# Patient Record
Sex: Male | Born: 1970 | Race: White | Hispanic: No | Marital: Married | Smoking: Never smoker
Health system: Southern US, Community
[De-identification: ages and names within clinical notes are randomized; demographics above are authoritative.]

---

## 2011-04-01 DIAGNOSIS — N400 Enlarged prostate without lower urinary tract symptoms: Secondary | ICD-10-CM | POA: Insufficient documentation

## 2013-01-19 DIAGNOSIS — F329 Major depressive disorder, single episode, unspecified: Secondary | ICD-10-CM | POA: Insufficient documentation

## 2013-01-19 DIAGNOSIS — F419 Anxiety disorder, unspecified: Secondary | ICD-10-CM

## 2013-01-19 DIAGNOSIS — F410 Panic disorder [episodic paroxysmal anxiety] without agoraphobia: Secondary | ICD-10-CM | POA: Insufficient documentation

## 2013-06-29 DIAGNOSIS — M25569 Pain in unspecified knee: Secondary | ICD-10-CM | POA: Insufficient documentation

## 2013-06-29 DIAGNOSIS — M79643 Pain in unspecified hand: Secondary | ICD-10-CM | POA: Insufficient documentation

## 2013-06-29 DIAGNOSIS — M064 Inflammatory polyarthropathy: Secondary | ICD-10-CM | POA: Insufficient documentation

## 2013-07-14 DIAGNOSIS — E785 Hyperlipidemia, unspecified: Secondary | ICD-10-CM | POA: Insufficient documentation

## 2013-08-09 DIAGNOSIS — M1990 Unspecified osteoarthritis, unspecified site: Secondary | ICD-10-CM | POA: Insufficient documentation

## 2014-01-07 DIAGNOSIS — G43109 Migraine with aura, not intractable, without status migrainosus: Secondary | ICD-10-CM | POA: Insufficient documentation

## 2015-07-23 ENCOUNTER — Other Ambulatory Visit: Payer: Self-pay | Admitting: Neurosurgery

## 2015-07-23 DIAGNOSIS — M5412 Radiculopathy, cervical region: Secondary | ICD-10-CM

## 2015-07-31 ENCOUNTER — Ambulatory Visit
Admission: RE | Admit: 2015-07-31 | Discharge: 2015-07-31 | Disposition: A | Discharge status: Home / Self Care | Payer: BC Managed Care – PPO | Source: Ambulatory Visit | Attending: Neurosurgery | Admitting: Neurosurgery

## 2015-07-31 ENCOUNTER — Other Ambulatory Visit: Payer: Self-pay

## 2015-07-31 DIAGNOSIS — M5412 Radiculopathy, cervical region: Secondary | ICD-10-CM

## 2015-07-31 DIAGNOSIS — E669 Obesity, unspecified: Secondary | ICD-10-CM | POA: Insufficient documentation

## 2015-07-31 DIAGNOSIS — E66811 Obesity, class 1: Secondary | ICD-10-CM | POA: Insufficient documentation

## 2015-07-31 DIAGNOSIS — Z91030 Bee allergy status: Secondary | ICD-10-CM | POA: Insufficient documentation

## 2015-07-31 MED ORDER — IOPAMIDOL (ISOVUE-M 300) INJECTION 61%
10.0000 mL | Freq: Once | INTRAMUSCULAR | Status: AC | PRN
  Administered 2015-07-31: 10 mL via INTRATHECAL

## 2015-07-31 MED ORDER — DIAZEPAM 5 MG PO TABS
10.0000 mg | ORAL_TABLET | Freq: Once | ORAL | Status: AC
  Administered 2015-07-31: 10 mg via ORAL

## 2015-07-31 NOTE — Progress Notes (Signed)
Pt states he has been off Cymbalta since Saturday.

## 2015-07-31 NOTE — Discharge Instructions (Signed)
Myelogram Discharge Instructions  1. Go home and rest quietly for the next 24 hours.  It is important to lie flat for the next 24 hours.  Get up only to go to the restroom.  You may lie in the bed or on a couch on your back, your stomach, your left side or your right side.  You may have one pillow under your head.  You may have pillows between your knees while you are on your side or under your knees while you are on your back.  2. DO NOT drive today.  Recline the seat as far back as it will go, while still wearing your seat belt, on the way home.  3. You may get up to go to the bathroom as needed.  You may sit up for 10 minutes to eat.  You may resume your normal diet and medications unless otherwise indicated.  Drink lots of extra fluids today and tomorrow.  4. The incidence of headache, nausea, or vomiting is about 5% (one in 20 patients).  If you develop a headache, lie flat and drink plenty of fluids until the headache goes away.  Caffeinated beverages may be helpful.  If you develop severe nausea and vomiting or a headache that does not go away with flat bed rest, call [phone].  5. You may resume normal activities after your 24 hours of bed rest is over; however, do not exert yourself strongly or do any heavy lifting tomorrow. If when you get up you have a headache when standing, go back to bed and force fluids for another 24 hours.  6. Call your physician for a follow-up appointment.  The results of your myelogram will be sent directly to your physician by the following day.  7. If you have any questions or if complications develop after you arrive home, please call [phone].  Discharge instructions have been explained to the patient.  The patient, or the person responsible for the patient, fully understands these instructions.       May resume Lexapro on Aug 01, 2015, after 11:00 am.

## 2017-08-16 IMAGING — CR DG MYELOGRAPHY LUMBAR INJ CERVICAL
14 series · 14 of 14 positions shown · non-contrast
Comparison: none

CLINICAL DATA: Neck pain.  LEFT arm pain.
TECHNIQUE: Contiguous axial images were obtained through the Cervical spine
after the intrathecal infusion of infusion. Coronal and sagittal
reconstructions were obtained of the axial image sets.

[w cervical spine lat]
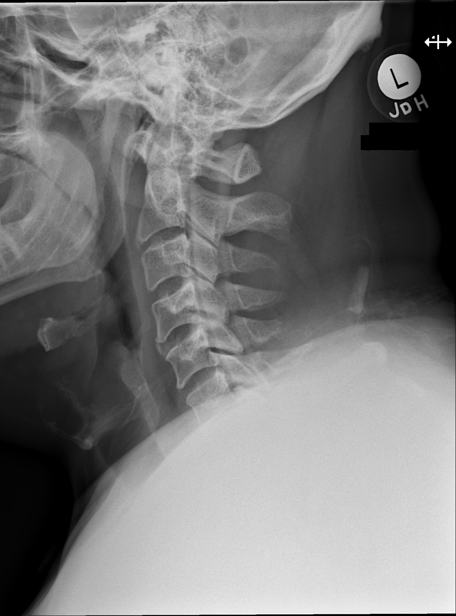

[vasc adipose (1 of 11)]
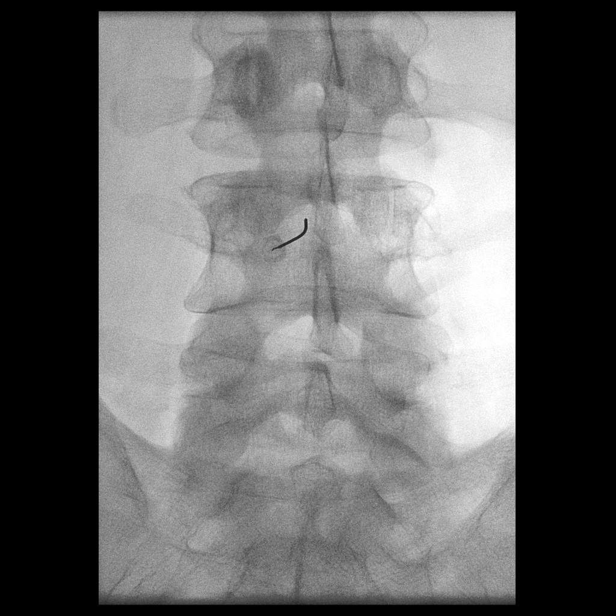

[w cervical spine flexion]
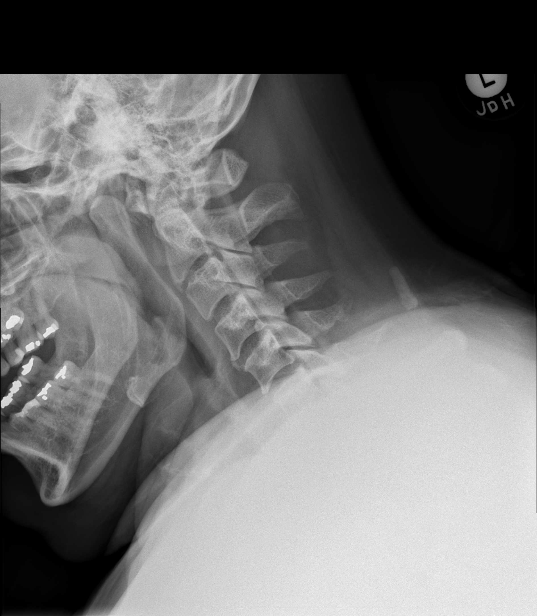

[vasc adipose (2 of 11)]
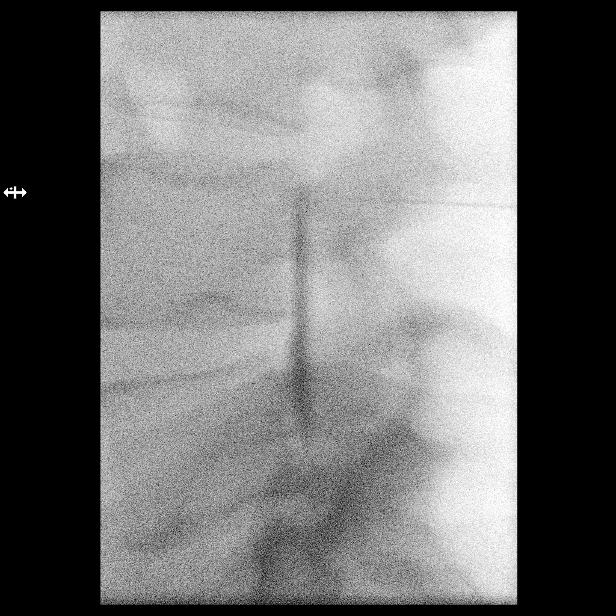

[w cervical spine extension]
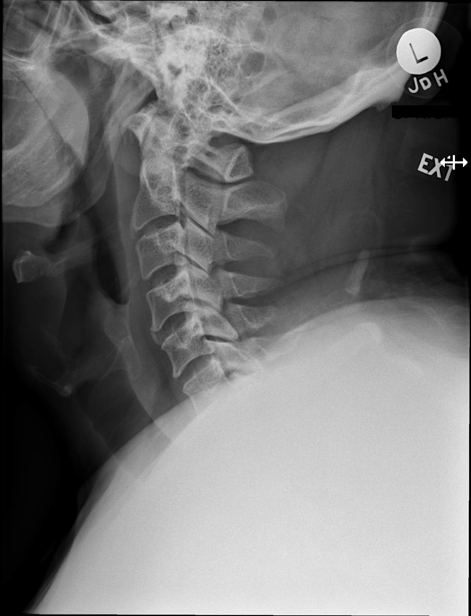

[vasc adipose (3 of 11)]
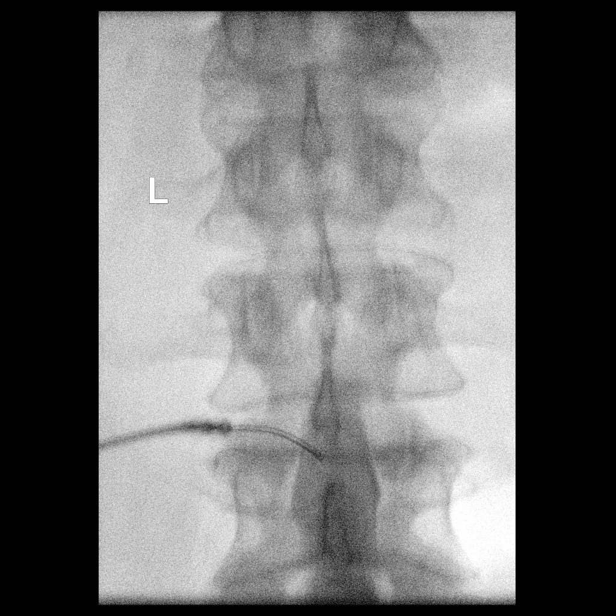

[vasc adipose (4 of 11)]
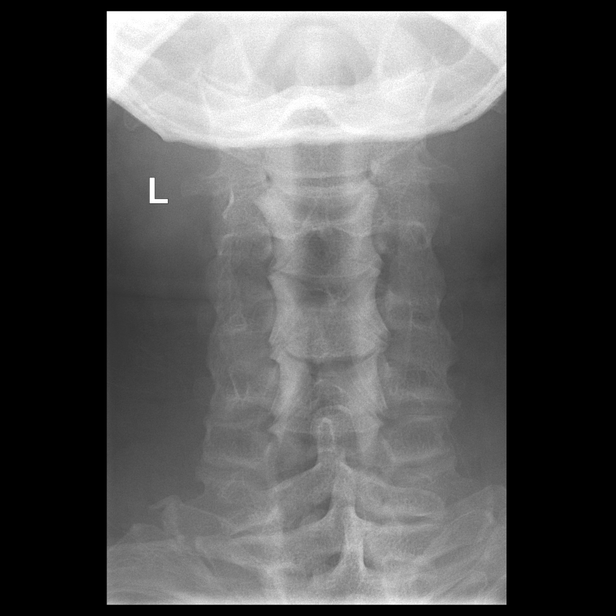

[vasc adipose (5 of 11)]
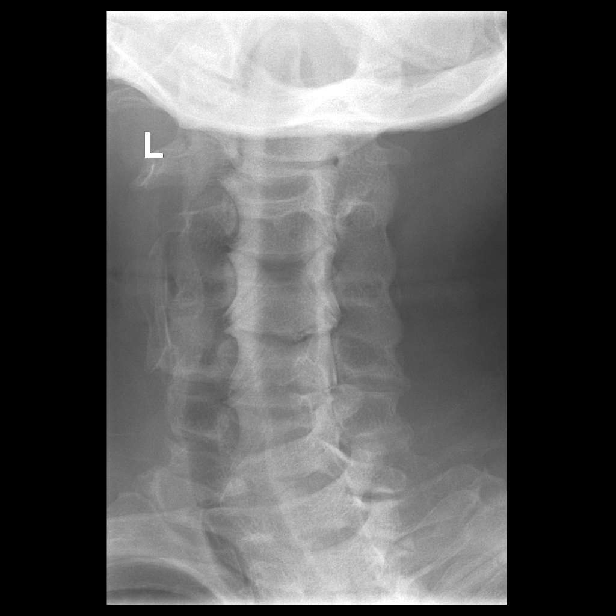

[vasc adipose (6 of 11)]
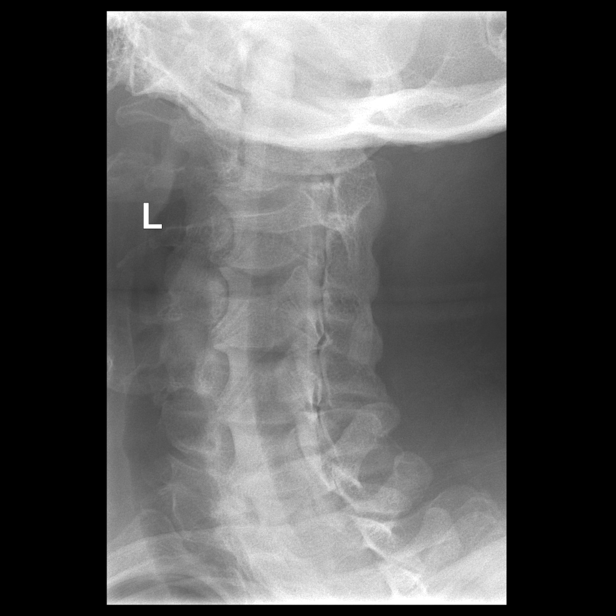

[vasc adipose (7 of 11)]
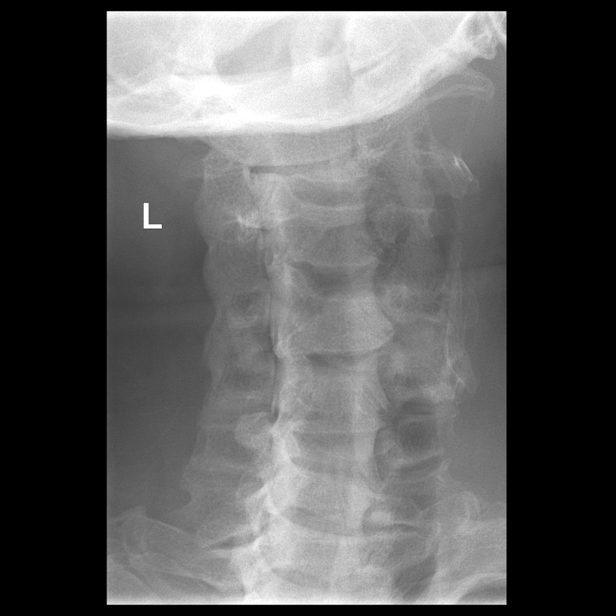

[vasc adipose (8 of 11)]
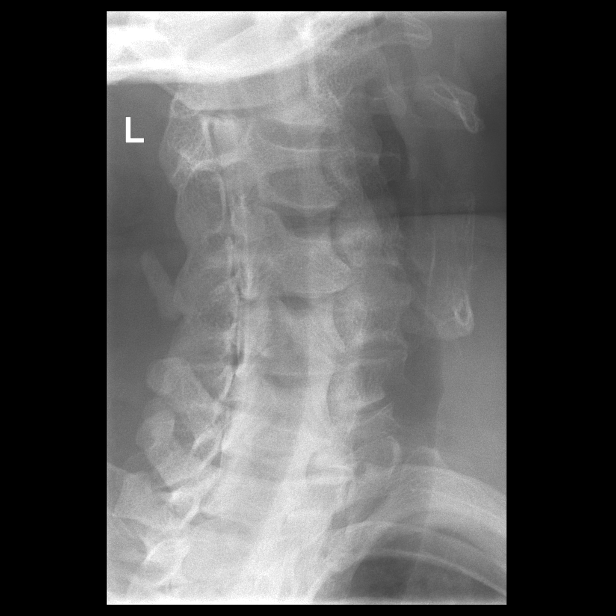

[vasc adipose (9 of 11)]
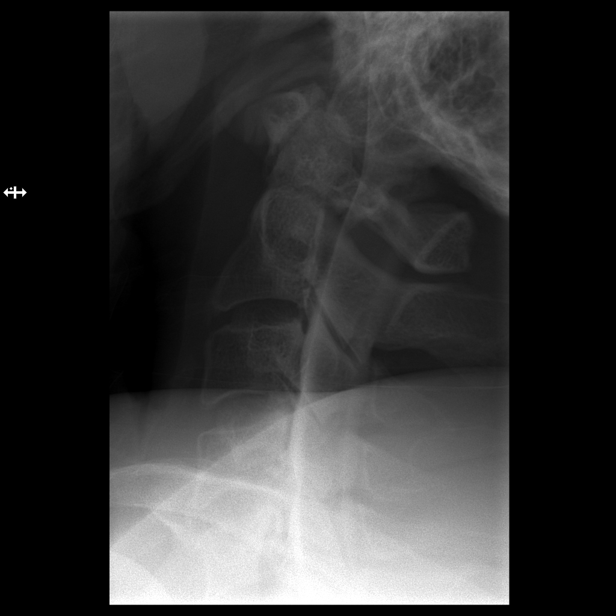

[vasc adipose (10 of 11)]
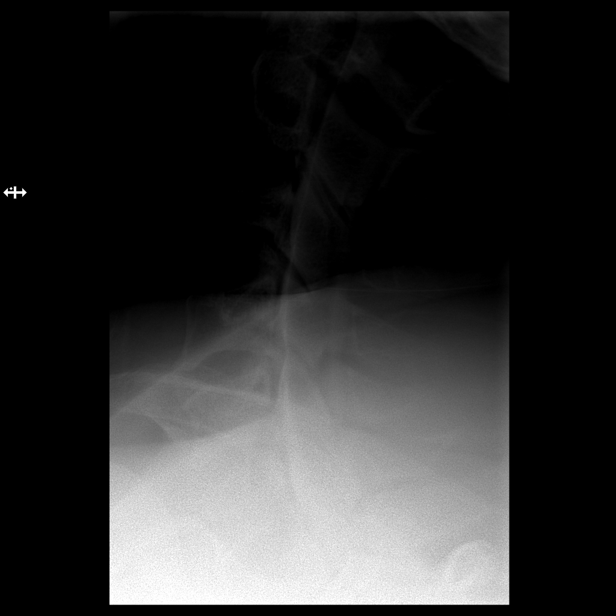

[vasc adipose (11 of 11)]
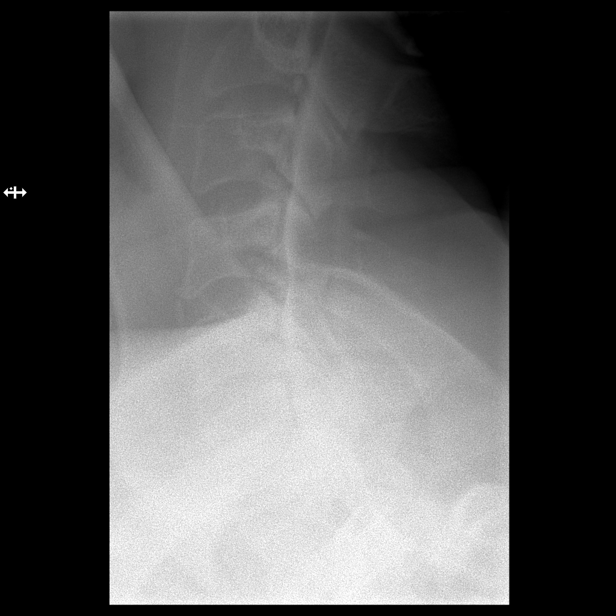

[14 of 14 positions shown; findings below may reference images not displayed]

FLUOROSCOPY TIME:  1 minutes and 28 seconds corresponding to a Dose
Area Product of 283.73 ?Gy*m2

PROCEDURE:
LUMBAR PUNCTURE FOR CERVICAL MYELOGRAM

After thorough discussion of risks and benefits of the procedure
including bleeding, infection, injury to nerves, blood vessels,
adjacent structures as well as headache and CSF leak, written and
oral informed consent was obtained. Consent was obtained by Dr. Saiful Khan
Tiger. We discussed the high likelihood of obtaining a diagnostic
study.

Patient was positioned prone on the fluoroscopy table. Local
anesthesia was provided with 1% lidocaine without epinephrine after
prepped and draped in the usual sterile fashion. Puncture was
performed at L3-L4 using a 3 1/2 inch 22-gauge spinal needle via
midline approach. Using a single pass through the dura, the needle
was placed within the thecal sac, with return of clear CSF. 10 mL of
Isovue-M 300 was injected into the thecal sac, with normal
opacification of the nerve roots and cauda equina consistent with
free flow within the subarachnoid space. The patient was then moved
to the trendelenburg position and contrast flowed into the Cervical
spine region.

I personally performed the lumbar puncture and administered the
intrathecal contrast. I also personally supervised acquisition of
the myelogram images.
FINDINGS: CERVICAL MYELOGRAM FINDINGS:

Good opacification cervical subarachnoid space.

The cervical nerve nerve roots fill out symmetrically without
significant cut off or spinal stenosis. The alignment is anatomic.
There is no dynamic instability.

CT CERVICAL MYELOGRAM FINDINGS:

Alignment: Anatomic

Vertebrae: No worrisome osseous lesion.

Cord: No cord flattening

Posterior Fossa: No tonsillar herniation.

Vertebral Arteries: Not assessed

Paraspinal tissues: Unremarkable.  No lung apex lesion.

Disc levels:

The individual disc spaces were examined as follows:

C2-3:  Normal.

C3-4:  Normal.

C4-5:  Rightward uncinate spurring.  No C5 nerve root impingement.

C5-6: Central disc osteophyte complex. Shallow central protrusion is
superimposed. No impingement.

C6-7: Ossification of the posterior longitudinal ligament, LEFT
paramedian location. Effacement anterior subarachnoid space without
cord compression. No C7 nerve root impingement.

C7-T1:  Unremarkable.
IMPRESSION: Minor cervical disc pathology and OPLL. No specific compressive
lesion is observed.
